# Patient Record
Sex: Female | Born: 1975 | Race: White | Hispanic: No | Marital: Single | Smoking: Never smoker
Health system: Southern US, Community
[De-identification: ages and names within clinical notes are randomized; demographics above are authoritative.]

---

## 2012-01-27 ENCOUNTER — Encounter: Payer: Self-pay | Admitting: Obstetrics and Gynecology

## 2012-01-27 DIAGNOSIS — Z01419 Encounter for gynecological examination (general) (routine) without abnormal findings: Secondary | ICD-10-CM

## 2019-05-11 ENCOUNTER — Emergency Department (HOSPITAL_COMMUNITY): Payer: Self-pay

## 2019-05-11 ENCOUNTER — Other Ambulatory Visit: Payer: Self-pay

## 2019-05-11 ENCOUNTER — Emergency Department (HOSPITAL_COMMUNITY)
Admission: EM | Admit: 2019-05-11 | Discharge: 2019-05-11 | Disposition: A | Discharge status: Home / Self Care | Payer: Self-pay | Attending: Emergency Medicine | Admitting: Emergency Medicine

## 2019-05-11 ENCOUNTER — Encounter (HOSPITAL_COMMUNITY): Payer: Self-pay | Admitting: Emergency Medicine

## 2019-05-11 DIAGNOSIS — W19XXXA Unspecified fall, initial encounter: Secondary | ICD-10-CM

## 2019-05-11 DIAGNOSIS — R52 Pain, unspecified: Secondary | ICD-10-CM

## 2019-05-11 DIAGNOSIS — R0789 Other chest pain: Secondary | ICD-10-CM | POA: Insufficient documentation

## 2019-05-11 DIAGNOSIS — M549 Dorsalgia, unspecified: Secondary | ICD-10-CM

## 2019-05-11 NOTE — ED Provider Notes (Signed)
[facility] DEPT Provider Note   CSN: [id number] Arrival date & time: 05/11/19  0509    History   Chief Complaint Chief Complaint  Patient presents with  . Fall    HPI Shametra Cumberland is a 43 y.o. female.     HPI   Pt is a 43 y/o female who presents to the ED today for eval after a fall. States she was moving the lawn with a push mower when she slipped and fell and the lawn mower landed on her. She is c/o bilat upper back pain and posterior rib pain. States pain is worse when she takes a deep breath. Pain radiates up the back to towards the neck. States she has coughed up a small amount of blood since this happened. She feels somewhat sob from the pain. Has tried taking motrin for her symptoms without relief.   Denies head trauma or loc. No other injuries.   History reviewed. No pertinent past medical history.  There are no active problems to display for this patient.   History reviewed. No pertinent surgical history.   OB History   No obstetric history on file.      Home Medications    Prior to Admission medications   Not on File    Family History History reviewed. No pertinent family history.  Social History Social History   Tobacco Use  . Smoking status: Never Smoker  . Smokeless tobacco: Never Used  Substance Use Topics  . Alcohol use: Never    Frequency: Never  . Drug use: Never     Allergies   Patient has no known allergies.   Review of Systems Review of Systems  Constitutional: Negative for fever.  HENT: Negative for ear pain and sore throat.   Eyes: Negative for visual disturbance.  Respiratory: Positive for shortness of breath (2/2 pain). Negative for cough.        Hemoptysis  Cardiovascular: Negative for chest pain.  Gastrointestinal: Positive for nausea. Negative for abdominal pain, constipation, diarrhea and vomiting.  Genitourinary: Negative for dysuria and hematuria.  Musculoskeletal: Positive for back  pain.  Skin: Negative for color change and rash.  Neurological: Negative for headaches.  All other systems reviewed and are negative.    Physical Exam Updated Vital Signs BP 133/79 (BP Location: Left Arm)   Pulse 85   Temp 98.3 F (36.8 C) (Oral)   Resp 18   Ht 5\' 6"  (1.676 m)   Wt 61.2 kg   LMP 05/08/2019   SpO2 100%   BMI 21.79 kg/m   Physical Exam Vitals signs and nursing note reviewed.  Constitutional:      General: She is not in acute distress.    Appearance: She is well-developed.  HENT:     Head: Normocephalic and atraumatic.  Eyes:     Conjunctiva/sclera: Conjunctivae normal.  Neck:     Musculoskeletal: Neck supple.  Cardiovascular:     Rate and Rhythm: Normal rate and regular rhythm.     Pulses: Normal pulses.     Heart sounds: Normal heart sounds. No murmur.  Pulmonary:     Effort: Pulmonary effort is normal. No respiratory distress.     Breath sounds: Normal breath sounds. No wheezing, rhonchi or rales.  Abdominal:     General: Bowel sounds are normal. There is no distension.     Palpations: Abdomen is soft.     Tenderness: There is no abdominal tenderness. There is no guarding or rebound.  Musculoskeletal:  Comments:   No midline tenderness but does have some tenderness to the bilateral paraspinous muscles of the mid/upper thoracic spine as well as to the posterior ribs bilaterally.  No external signs of trauma.  Skin:    General: Skin is warm and dry.  Neurological:     Mental Status: She is alert.      ED Treatments / Results  Labs (all labs ordered are listed, but only abnormal results are displayed) Labs Reviewed - No data to display  EKG None  Radiology No results found.  Procedures Procedures (including critical care time)  Medications Ordered in ED Medications - No data to display   Initial Impression / Assessment and Plan / ED Course  I have reviewed the triage vital signs and the nursing notes.  Pertinent labs & imaging  results that were available during my care of the patient were reviewed by me and considered in my medical decision making (see chart for details).     Final Clinical Impressions(s) / ED Diagnoses   Final diagnoses:  Fall, initial encounter  Back pain, unspecified back location, unspecified back pain laterality, unspecified chronicity   43 year old female presenting for evaluation after she slipped and fell today in her push lawnmower fell on top of her injuring her backslash posterior ribs.  Has tried Motrin at home without relief.  She has been coughing up some blood today.  She has some shortness of breath secondary to pain.  She has no other associated symptoms.  On exam, patient ambulating in the room and very anxious to leave.  Lungs are clear to auscultation bilaterally.  Cardiac exam is benign.  No midline tenderness but does have some tenderness to the bilateral paraspinous muscles of the mid/upper thoracic spine as well as to the posterior ribs bilaterally.  No external signs of trauma.  I advised patient we would get an x-ray of her chest and bilateral ribs.  She was very anxious to leave and wanted to know how long it would take for the results.  I advised her that it would not be long and we would keep her updated.  I was later informed by nursing staff that patient did not want to wait for her results and wanted to leave AMA.  Patient eloped prior to me being able to evaluate her tell her results of her x-ray.  ED Discharge Orders    None       Rayne DuCouture, Delphin Funes S, PA-C 05/11/19 0650    Paula LibraMolpus, John, MD 05/11/19 [id number]

## 2019-05-11 NOTE — ED Triage Notes (Signed)
Patient states that earlier yesterday she was mowing her hill and she and the lawn mower flipped. Patient thought that she would be ok. Patient states she can not take the pain and she is having blood in her sputum.

## 2019-05-11 NOTE — ED Notes (Signed)
Patient informed that she would have to sign out AMA and was made aware of risks of leaving before tests are completed, but patient states "I have to leave, I am in so much pain and sitting here isn't helping. I have my daughter in the car and I am worried that I didn't lock the door." Patient counseling provided, but patient insisted that she has to leave.

## 2019-05-11 NOTE — ED Notes (Signed)
Pt ambulatory to and from x-ray with a steady gait.

## 2020-04-23 ENCOUNTER — Other Ambulatory Visit: Payer: Self-pay

## 2020-04-23 ENCOUNTER — Emergency Department (HOSPITAL_COMMUNITY)
Admission: EM | Admit: 2020-04-23 | Discharge: 2020-04-23 | Disposition: A | Discharge status: Home / Self Care | Payer: Medicaid Other | Attending: Emergency Medicine | Admitting: Emergency Medicine

## 2020-04-23 ENCOUNTER — Encounter (HOSPITAL_COMMUNITY): Payer: Self-pay

## 2020-04-23 ENCOUNTER — Emergency Department (HOSPITAL_COMMUNITY): Payer: Medicaid Other

## 2020-04-23 DIAGNOSIS — Y939 Activity, unspecified: Secondary | ICD-10-CM | POA: Insufficient documentation

## 2020-04-23 DIAGNOSIS — S52125A Nondisplaced fracture of head of left radius, initial encounter for closed fracture: Secondary | ICD-10-CM | POA: Insufficient documentation

## 2020-04-23 DIAGNOSIS — Y929 Unspecified place or not applicable: Secondary | ICD-10-CM | POA: Insufficient documentation

## 2020-04-23 DIAGNOSIS — W1830XA Fall on same level, unspecified, initial encounter: Secondary | ICD-10-CM | POA: Insufficient documentation

## 2020-04-23 DIAGNOSIS — Y999 Unspecified external cause status: Secondary | ICD-10-CM | POA: Insufficient documentation

## 2020-04-23 DIAGNOSIS — S52502A Unspecified fracture of the lower end of left radius, initial encounter for closed fracture: Secondary | ICD-10-CM | POA: Insufficient documentation

## 2020-04-23 DIAGNOSIS — S59912A Unspecified injury of left forearm, initial encounter: Secondary | ICD-10-CM | POA: Diagnosis present

## 2020-04-23 DIAGNOSIS — W19XXXA Unspecified fall, initial encounter: Secondary | ICD-10-CM

## 2020-04-23 MED ORDER — ONDANSETRON HCL 4 MG PO TABS
4.0000 mg | ORAL_TABLET | Freq: Once | ORAL | Status: AC
  Administered 2020-04-23: 4 mg via ORAL
  Filled 2020-04-23: qty 1

## 2020-04-23 MED ORDER — HYDROCODONE-ACETAMINOPHEN 5-325 MG PO TABS: 1.0000 | ORAL_TABLET | Freq: Four times a day (QID) | ORAL | 0 refills | Status: AC | PRN

## 2020-04-23 MED ORDER — HYDROCODONE-ACETAMINOPHEN 5-325 MG PO TABS
1.0000 | ORAL_TABLET | Freq: Once | ORAL | Status: AC
  Administered 2020-04-23: 1 via ORAL
  Filled 2020-04-23: qty 1

## 2020-04-23 NOTE — Discharge Instructions (Signed)
Take ibuprofen 3 times a day with meals (no more than 4 at a time).  Do not take other anti-inflammatories at the same time (Advil, Motrin, naproxen, Aleve). You may supplement with Tylenol if you need further pain control. Use norco as needed for severe or breakthrough pain. Have caution, this may make you tired or groggy. Do not drive or operate heavy machinery while taking this medicine.  Use ice packs over the splint to help with pain and swelling.  Keep your wrist elevated to help with swelling.  Call the doctor listed below for follow up evaluation of your wrist.  Return to the ER if you develop numbness, severe worsening pain, color change of your fingers, or any new, worsening, or concerning symptoms.

## 2020-04-23 NOTE — ED Notes (Signed)
Ortho at bedside.

## 2020-04-23 NOTE — ED Provider Notes (Signed)
[facility] EMERGENCY DEPARTMENT Provider Note   CSN: [id number] Arrival date & time: 04/23/20  0258     History Chief Complaint  Patient presents with  . Arm Injury    Erica Ryan is a 44 y.o. female presenting for evaluation of left Patient states around 8 PM yesterday she slipped, and fell backwards reaching out with her left arm to catch her fall.  Since then, she has had severe pain in her left wrist/hand, little bit left elbow.  She denies hitting her head or loss of consciousness.  She denies injury elsewhere.  She continued ibuprofen and Excedrin without improvement of symptoms.  She denies numbness or tingling.  She is right-hand dominant.  Pain does not radiate. She is not on blood thinners.   HPI     History reviewed. No pertinent past medical history.  There are no problems to display for this patient.   History reviewed. No pertinent surgical history.   OB History   No obstetric history on file.     No family history on file.  Social History   Tobacco Use  . Smoking status: Never Smoker  . Smokeless tobacco: Never Used  Substance Use Topics  . Alcohol use: Never  . Drug use: Never    Home Medications Prior to Admission medications   Medication Sig Start Date End Date Taking? Authorizing Provider  HYDROcodone-acetaminophen (NORCO/VICODIN) 5-325 MG tablet Take 1 tablet by mouth every 6 (six) hours as needed for severe pain. 04/23/20   Damaria Stofko, PA-C    Allergies    Patient has no known allergies.  Review of Systems   Review of Systems  Musculoskeletal: Positive for arthralgias.  Neurological: Negative for numbness.    Physical Exam Updated Vital Signs BP 127/85 (BP Location: Right Arm)   Pulse 75   Temp 98.5 F (36.9 C) (Oral)   Resp 18   SpO2 98%   Physical Exam Vitals and nursing note reviewed.  Constitutional:      General: She is not in acute distress.    Appearance: She is well-developed.  HENT:   Head: Normocephalic and atraumatic.  Pulmonary:     Effort: Pulmonary effort is normal.  Abdominal:     General: There is no distension.  Musculoskeletal:        General: Swelling and tenderness present. Normal range of motion.     Cervical back: Normal range of motion.     Comments: Swelling of the left radial wrist and radial hand.  Pain in the entire base of the thumb, including over the anatomic labs.  Radial pulse 2+ bilaterally.  Good distal sensation and cap refill of the left fingers.  No tenderness palpation of the forearm.  Mild tenderness palpation of the left radial head.   Skin:    General: Skin is warm.     Capillary Refill: Capillary refill takes less than 2 seconds.     Findings: No rash.  Neurological:     Mental Status: She is alert and oriented to person, place, and time.     ED Results / Procedures / Treatments   Labs (all labs ordered are listed, but only abnormal results are displayed) Labs Reviewed - No data to display  EKG None  Radiology DG Forearm Left  Result Date: 04/23/2020 CLINICAL DATA:  Pain following fall EXAM: LEFT FOREARM - 2 VIEW COMPARISON:  None. FINDINGS: Frontal and lateral views were obtained. Nondisplaced fracture distal radial metaphysis. Suspected subtle impaction injury in  the proximal radial metaphysis. No other fractures are evident. No dislocation. No appreciable joint space narrowing or erosion. IMPRESSION: 1.  Nondisplaced fracture distal radial metaphysis. 2. Suspect subtle impaction injury in the proximal radial metaphysis with subtle cortical irregularity along the dorsal, lateral aspect of the radial metaphysis. 3.  No dislocation.  No evident arthropathy. Electronically Signed   By: Lowella Grip III M.D.   On: 04/23/2020 10:07   DG Wrist Complete Left  Result Date: 04/23/2020 CLINICAL DATA:  Pain following fall EXAM: LEFT WRIST - COMPLETE 3+ VIEW COMPARISON:  None. FINDINGS: Frontal, oblique, lateral, and ulnar deviation  scaphoid images were obtained. There is a nondisplaced fracture of the distal radial metaphysis with alignment essentially anatomic. No other fracture. No dislocation. Joint spaces appear normal. No erosive change. IMPRESSION: Nondisplaced fracture distal radial metaphysis. No other fracture. No dislocation. No appreciable arthropathic change. Electronically Signed   By: Lowella Grip III M.D.   On: 04/23/2020 10:06   DG Hand Complete Left  Result Date: 04/23/2020 CLINICAL DATA:  Pain following fall EXAM: LEFT HAND - COMPLETE 3+ VIEW COMPARISON:  None. FINDINGS: Frontal, oblique, and lateral views were obtained. There is a nondisplaced fracture of the distal radial metaphysis. No other fracture evident. No dislocation. Joint spaces appear normal. There is a bone island in the proximal aspect of the second proximal phalanx. IMPRESSION: Nondisplaced fracture distal radial metaphysis. No other fracture. No dislocation. No appreciable joint space narrowing or erosion. Electronically Signed   By: Lowella Grip III M.D.   On: 04/23/2020 10:05    Procedures Procedures (including critical care time)  Medications Ordered in ED Medications  ondansetron (ZOFRAN) tablet 4 mg (has no administration in time range)  HYDROcodone-acetaminophen (NORCO/VICODIN) 5-325 MG per tablet 1 tablet (has no administration in time range)    ED Course  I have reviewed the triage vital signs and the nursing notes.  Pertinent labs & imaging results that were available during my care of the patient were reviewed by me and considered in my medical decision making (see chart for details).    MDM Rules/Calculators/A&P                      Pt presenting for evaluation of left wrist and arm pain after a fall last night.  On exam, patient has mild swelling and tenderness.  She is neurovascularly intact.  Compartments are soft, not consistent with compartment syndrome.  X-rays obtained from triage read interpreted by me,  shows distal radius fracture which is nondisplaced.  Per radiology, possible impaction fracture of the radial head.  Discussed findings with patient.  Discussed treatment with a splint and sling.  As patient has pain over the anatomic snuffbox as well, will place in a thumb spica although no scaphoid fracture was noted on x-ray.  Discussed importance of ice, elevation, pain control.  Encourage follow-up with hand for further management.  At this time, patient appears safe for discharge.  Return precautions given.  Patient states she understands and agrees to plan.  Final Clinical Impression(s) / ED Diagnoses Final diagnoses:  Closed fracture of distal end of left radius, unspecified fracture morphology, initial encounter  Closed nondisplaced fracture of head of left radius, initial encounter  Fall, initial encounter    Rx / DC Orders ED Discharge Orders         Ordered    HYDROcodone-acetaminophen (NORCO/VICODIN) 5-325 MG tablet  Every 6 hours PRN     04/23/20 1113  Alveria Apley, PA-C 04/23/20 1119    Raeford Razor, MD 04/23/20 1143

## 2020-04-23 NOTE — ED Notes (Signed)
Pt d/c home per MD order. Discharge summary reviewed with pt, pt verbalizes understanding. Reports she has a discharge ride home Ambulatory off unit. No s.s of acute distress noted.

## 2020-04-23 NOTE — Progress Notes (Signed)
Orthopedic Tech Progress Note Patient Details:  Erica Ryan 1976/11/03 [id number]  Ortho Devices Type of Ortho Device: Arm sling, Thumb velcro splint Ortho Device/Splint Location: LUE Ortho Device/Splint Interventions: Ordered, Application   Post Interventions Patient Tolerated: Well Instructions Provided: Care of device   Donald Pore 04/23/2020, 11:37 AM

## 2020-04-23 NOTE — ED Notes (Signed)
Ortho tech called 

## 2020-04-23 NOTE — ED Triage Notes (Signed)
Pt reports mechanical fall last night, caught herself on her left hand, pt c.o pain to left hand all the way up her forearm. Swelling and bruising noted to thumb and wrist.

## 2020-04-24 ENCOUNTER — Other Ambulatory Visit: Payer: Self-pay

## 2020-04-24 ENCOUNTER — Encounter (HOSPITAL_COMMUNITY): Payer: Self-pay

## 2020-04-24 ENCOUNTER — Emergency Department (HOSPITAL_COMMUNITY)
Admission: EM | Admit: 2020-04-24 | Discharge: 2020-04-24 | Disposition: A | Discharge status: Home / Self Care | Payer: Medicaid Other | Attending: Emergency Medicine | Admitting: Emergency Medicine

## 2020-04-24 ENCOUNTER — Emergency Department (HOSPITAL_COMMUNITY)
Admission: EM | Admit: 2020-04-24 | Discharge: 2020-04-24 | Disposition: A | Discharge status: Home / Self Care | Payer: Self-pay | Attending: Emergency Medicine | Admitting: Emergency Medicine

## 2020-04-24 ENCOUNTER — Encounter (HOSPITAL_COMMUNITY): Payer: Self-pay | Admitting: Emergency Medicine

## 2020-04-24 DIAGNOSIS — Z5321 Procedure and treatment not carried out due to patient leaving prior to being seen by health care provider: Secondary | ICD-10-CM | POA: Diagnosis not present

## 2020-04-24 DIAGNOSIS — M79602 Pain in left arm: Secondary | ICD-10-CM | POA: Insufficient documentation

## 2020-04-24 DIAGNOSIS — M79642 Pain in left hand: Secondary | ICD-10-CM | POA: Insufficient documentation

## 2020-04-24 MED ORDER — NAPROXEN 500 MG PO TABS: 500.0000 mg | ORAL_TABLET | Freq: Two times a day (BID) | ORAL | 0 refills | Status: AC

## 2020-04-24 MED ORDER — OXYCODONE-ACETAMINOPHEN 5-325 MG PO TABS
1.0000 | ORAL_TABLET | Freq: Once | ORAL | Status: AC
  Administered 2020-04-24: 1 via ORAL
  Filled 2020-04-24: qty 1

## 2020-04-24 NOTE — ED Triage Notes (Signed)
Pt returns for left arm pain, seen yesterday and given a rx for Norco, states it does not help for pain

## 2020-04-24 NOTE — ED Notes (Signed)
Patient verbalizes understanding of discharge instructions. Opportunity for questioning and answers were provided. Pt discharged from ED. 

## 2020-04-24 NOTE — ED Triage Notes (Signed)
Pt c/o left arm pain, getting worse seen she was saw yesterday, pt states pain is not getting any better and the pain medication prescribed is not working.

## 2020-04-24 NOTE — ED Notes (Signed)
PT stated that she could no wait had to go pick up her daughter for school.

## 2020-04-24 NOTE — Discharge Instructions (Signed)
You have been seen today for hand pain. Please read and follow all provided instructions. Return to the emergency room for worsening condition or new concerning symptoms.    1. Medications:  Prescription sent to your pharmacy for naproxen.  This is an anti-inflammatory and will help with pain and swelling.  You should take this medicine with food so it does not cause an upset stomach.  Do not take any additional Aleve, ibuprofen, Motrin, Goody powders as these medicines are all similar. You can take this at the same time as the pain medicine to help with your pain.  Continue usual home medications Take medications as prescribed. Please review all of the medicines and only take them if you do not have an allergy to them.   2. Treatment: rest.  Wear the splint until you follow-up with a hand doctor  3. Follow Up:  Please follow up with the hand doctor they told you to see yesterday.  Call the office today to schedule the next available appointment.  Ultimately you need to see the hand doctor to help with your pain and swelling of your broken bones.   It is also a possibility that you have an allergic reaction to any of the medicines that you have been prescribed - Everybody reacts differently to medications and while MOST people have no trouble with most medicines, you may have a reaction such as nausea, vomiting, rash, swelling, shortness of breath. If this is the case, please stop taking the medicine immediately and contact your physician.  ?

## 2020-04-24 NOTE — ED Provider Notes (Signed)
[facility] EMERGENCY DEPARTMENT Provider Note   CSN: [id number] Arrival date & time: 04/24/20  9562     History Chief Complaint  Patient presents with  . Arm Pain    Erica Ryan is a 44 y.o. female with no known past medical history presenting to emergency department today with chief complaint of progressively worsening left arm pain.  Patient is right-hand dominant.  She was seen in the emergency department yesterday after a mechanical fall and found to have nondisplaced distal radius fracture as well as possible impaction fracture of the radial head.  Patient was treated with a sling.  She also had anatomic snuffbox tenderness and thumb spica was applied although x-ray was negative for any scaphoid fracture.  Patient was discharged home with prescription for Norco.  She was advised to follow-up with the on-call hand doctor.  Patient states the Norco did not help her pain.  She is rating pain 7 out of 10 in severity.  She describes the pain as a constant aching sensation.  Pain is worse with movement.  Pain is located in her left hand and does not radiate.  She has not been wearing the splint.  She denies any numbness or tingling.  She is not anticoagulated.  History reviewed. No pertinent past medical history.  There are no problems to display for this patient.   History reviewed. No pertinent surgical history.   OB History   No obstetric history on file.     No family history on file.  Social History   Tobacco Use  . Smoking status: Never Smoker  . Smokeless tobacco: Never Used  Substance Use Topics  . Alcohol use: Never  . Drug use: Never    Home Medications Prior to Admission medications   Medication Sig Start Date End Date Taking? Authorizing Provider  HYDROcodone-acetaminophen (NORCO/VICODIN) 5-325 MG tablet Take 1 tablet by mouth every 6 (six) hours as needed for severe pain. 04/23/20   Caccavale, Sophia, PA-C  naproxen (NAPROSYN) 500 MG tablet  Take 1 tablet (500 mg total) by mouth 2 (two) times daily for 5 days. 04/24/20 04/29/20  Ed Mandich, Caroleen Hamman, PA-C    Allergies    Patient has no known allergies.  Review of Systems   Review of Systems All other systems are reviewed and are negative for acute change except as noted in the HPI.  Physical Exam Updated Vital Signs BP 124/86 (BP Location: Right Arm)   Pulse 66   Temp 98.7 F (37.1 C) (Oral)   Resp 14   SpO2 100%   Physical Exam Vitals and nursing note reviewed.  Constitutional:      Appearance: She is well-developed. She is not ill-appearing or toxic-appearing.  HENT:     Head: Normocephalic and atraumatic.     Nose: Nose normal.  Eyes:     General: No scleral icterus.       Right eye: No discharge.        Left eye: No discharge.     Conjunctiva/sclera: Conjunctivae normal.  Neck:     Vascular: No JVD.  Cardiovascular:     Rate and Rhythm: Normal rate and regular rhythm.     Pulses: Normal pulses.          Radial pulses are 2+ on the right side and 2+ on the left side.     Heart sounds: Normal heart sounds.  Pulmonary:     Effort: Pulmonary effort is normal.     Breath sounds:  Normal breath sounds.  Abdominal:     General: There is no distension.  Musculoskeletal:        General: Normal range of motion.     Cervical back: Normal range of motion.     Comments: left hand with tenderness to palpation of dorsum and left thumb.   + swelling. There is no joint effusion noted. Decreased ROM 2/2 pain. No erythema or warmth overlaying the joint. There is anatomic snuff box tenderness. Normal sensation and motor function in the median, ulnar, and radial nerve distributions. 2+ radial pulse.   Skin:    General: Skin is warm and dry.  Neurological:     Mental Status: She is oriented to person, place, and time.     GCS: GCS eye subscore is 4. GCS verbal subscore is 5. GCS motor subscore is 6.     Comments: Fluent speech, no facial droop.  Psychiatric:         Behavior: Behavior normal.     ED Results / Procedures / Treatments   Labs (all labs ordered are listed, but only abnormal results are displayed) Labs Reviewed - No data to display  EKG None  Radiology DG Forearm Left  Result Date: 04/23/2020 CLINICAL DATA:  Pain following fall EXAM: LEFT FOREARM - 2 VIEW COMPARISON:  None. FINDINGS: Frontal and lateral views were obtained. Nondisplaced fracture distal radial metaphysis. Suspected subtle impaction injury in the proximal radial metaphysis. No other fractures are evident. No dislocation. No appreciable joint space narrowing or erosion. IMPRESSION: 1.  Nondisplaced fracture distal radial metaphysis. 2. Suspect subtle impaction injury in the proximal radial metaphysis with subtle cortical irregularity along the dorsal, lateral aspect of the radial metaphysis. 3.  No dislocation.  No evident arthropathy. Electronically Signed   By: Bretta Bang III M.D.   On: 04/23/2020 10:07   DG Wrist Complete Left  Result Date: 04/23/2020 CLINICAL DATA:  Pain following fall EXAM: LEFT WRIST - COMPLETE 3+ VIEW COMPARISON:  None. FINDINGS: Frontal, oblique, lateral, and ulnar deviation scaphoid images were obtained. There is a nondisplaced fracture of the distal radial metaphysis with alignment essentially anatomic. No other fracture. No dislocation. Joint spaces appear normal. No erosive change. IMPRESSION: Nondisplaced fracture distal radial metaphysis. No other fracture. No dislocation. No appreciable arthropathic change. Electronically Signed   By: Bretta Bang III M.D.   On: 04/23/2020 10:06   DG Hand Complete Left  Result Date: 04/23/2020 CLINICAL DATA:  Pain following fall EXAM: LEFT HAND - COMPLETE 3+ VIEW COMPARISON:  None. FINDINGS: Frontal, oblique, and lateral views were obtained. There is a nondisplaced fracture of the distal radial metaphysis. No other fracture evident. No dislocation. Joint spaces appear normal. There is a bone island in  the proximal aspect of the second proximal phalanx. IMPRESSION: Nondisplaced fracture distal radial metaphysis. No other fracture. No dislocation. No appreciable joint space narrowing or erosion. Electronically Signed   By: Bretta Bang III M.D.   On: 04/23/2020 10:05    Procedures Procedures (including critical care time)  Medications Ordered in ED Medications  oxyCODONE-acetaminophen (PERCOCET/ROXICET) 5-325 MG per tablet 1 tablet (has no administration in time range)    ED Course  I have reviewed the triage vital signs and the nursing notes.  Pertinent labs & imaging results that were available during my care of the patient were reviewed by me and considered in my medical decision making (see chart for details).    MDM Rules/Calculators/A&P  History provided by patient with additional history obtained from chart review.    Patient seen and examined. Patient presents awake, alert, hemodynamically stable, afebrile, non toxic.  Patient has worsening pain after fall with known nondisplaced fracture of distal radial metaphysis.  She has anatomic snuffbox tenderness.  She is not wearing the splint on my exam.  I offered her a repeat x-ray given her pain has worsened.  Patient states she has to leave to pick up her child from school and does not want to stay for the x-ray.  Her compartments are soft and left upper extremity.  She has full range of motion of her wrist.  Brisk cap refill.  Radial pulse 2+.  I will give patient dose of Percocet here as she has a ride home today.  I will also give prescription for naproxen to take in addition to the [place] she was prescribed yesterday.  She denies chance of pregnancy.  I discussed with patient the importance of wearing the splint and sling as well as following up with the on-call hand attending for definitive care. The patient appears reasonably screened and/or stabilized for discharge and I doubt any other medical condition or  other [facility] requiring further screening, evaluation, or treatment in the ED at this time prior to discharge. The patient is safe for discharge with strict return precautions discussed.    Portions of this note were generated with [employer]. Dictation errors may occur despite best attempts at proofreading.   Final Clinical Impression(s) / ED Diagnoses Final diagnoses:  Left hand pain    Rx / DC Orders ED Discharge Orders         Ordered    naproxen (NAPROSYN) 500 MG tablet  2 times daily     04/24/20 0928           Monica Codd, Harley Hallmark, PA-C 04/24/20 8101    Varney Biles, MD 04/26/20 1512

## 2021-07-18 IMAGING — DX DG WRIST COMPLETE 3+V*L*
4 series · 4 of 4 positions shown · non-contrast
Comparison: None.

CLINICAL DATA: Pain following fall

EXAM:
LEFT WRIST - COMPLETE 3+ VIEW

[wrist pa]
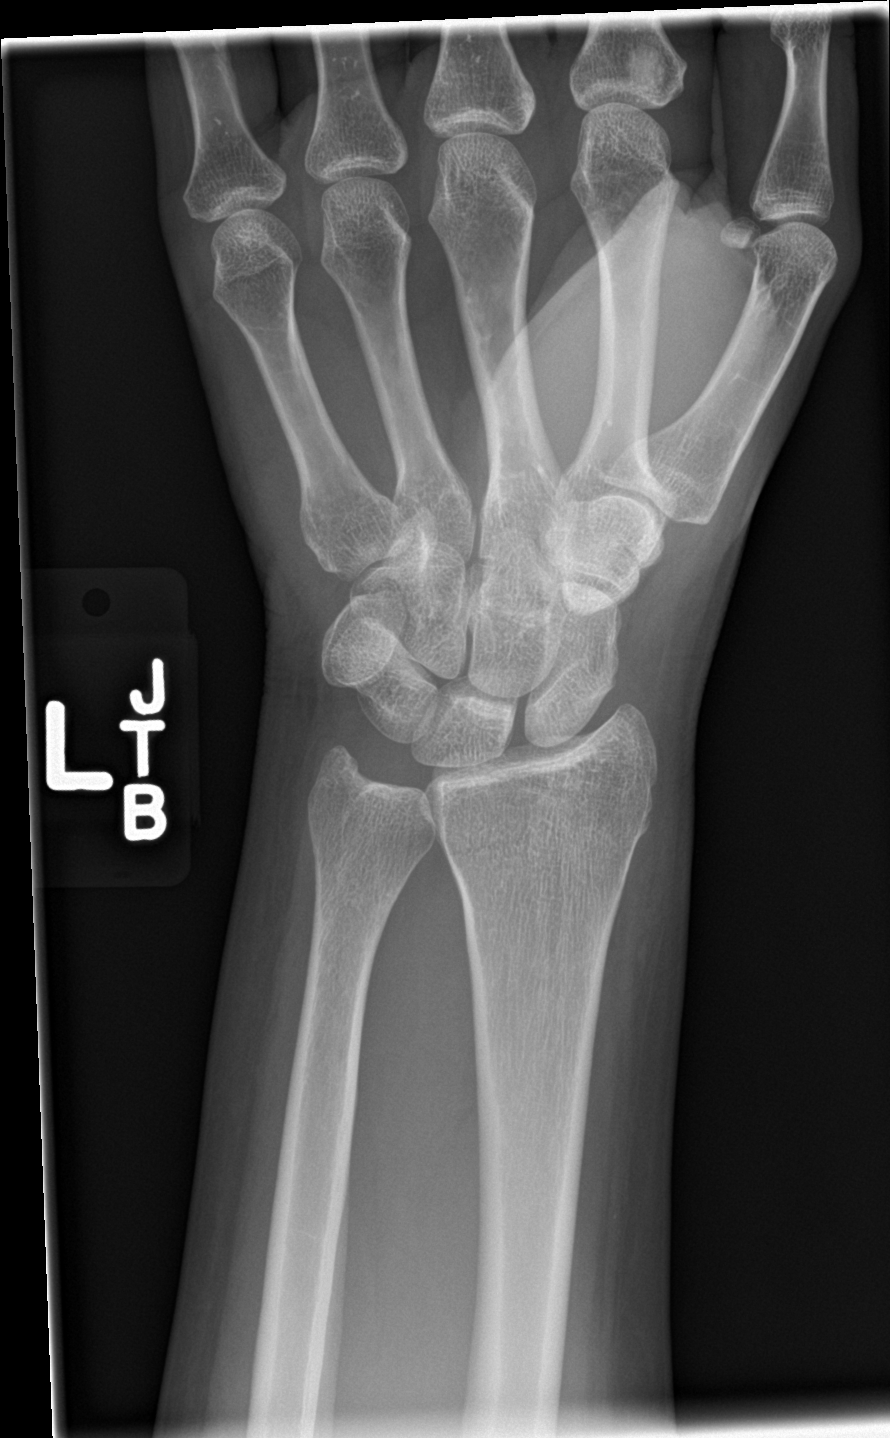

[wrist obl]
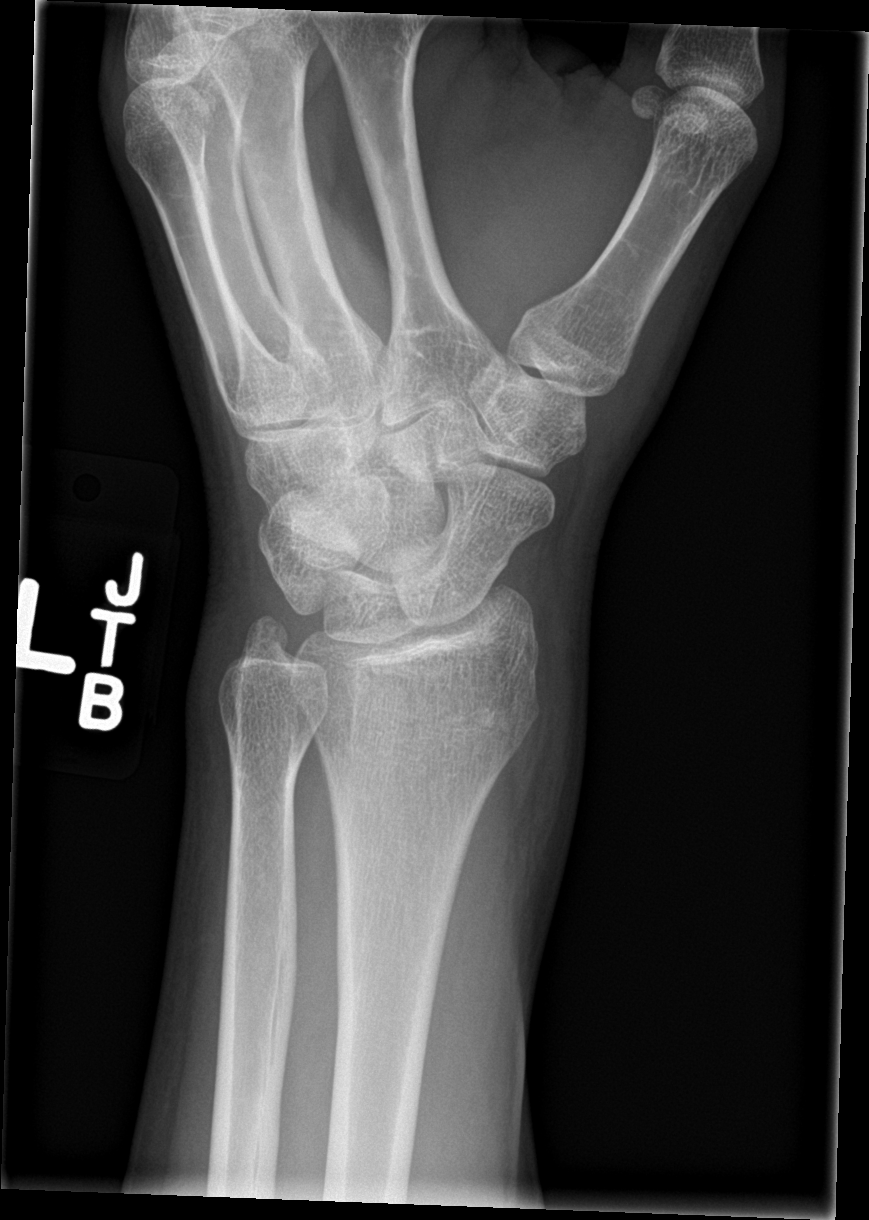

[wrist lat]
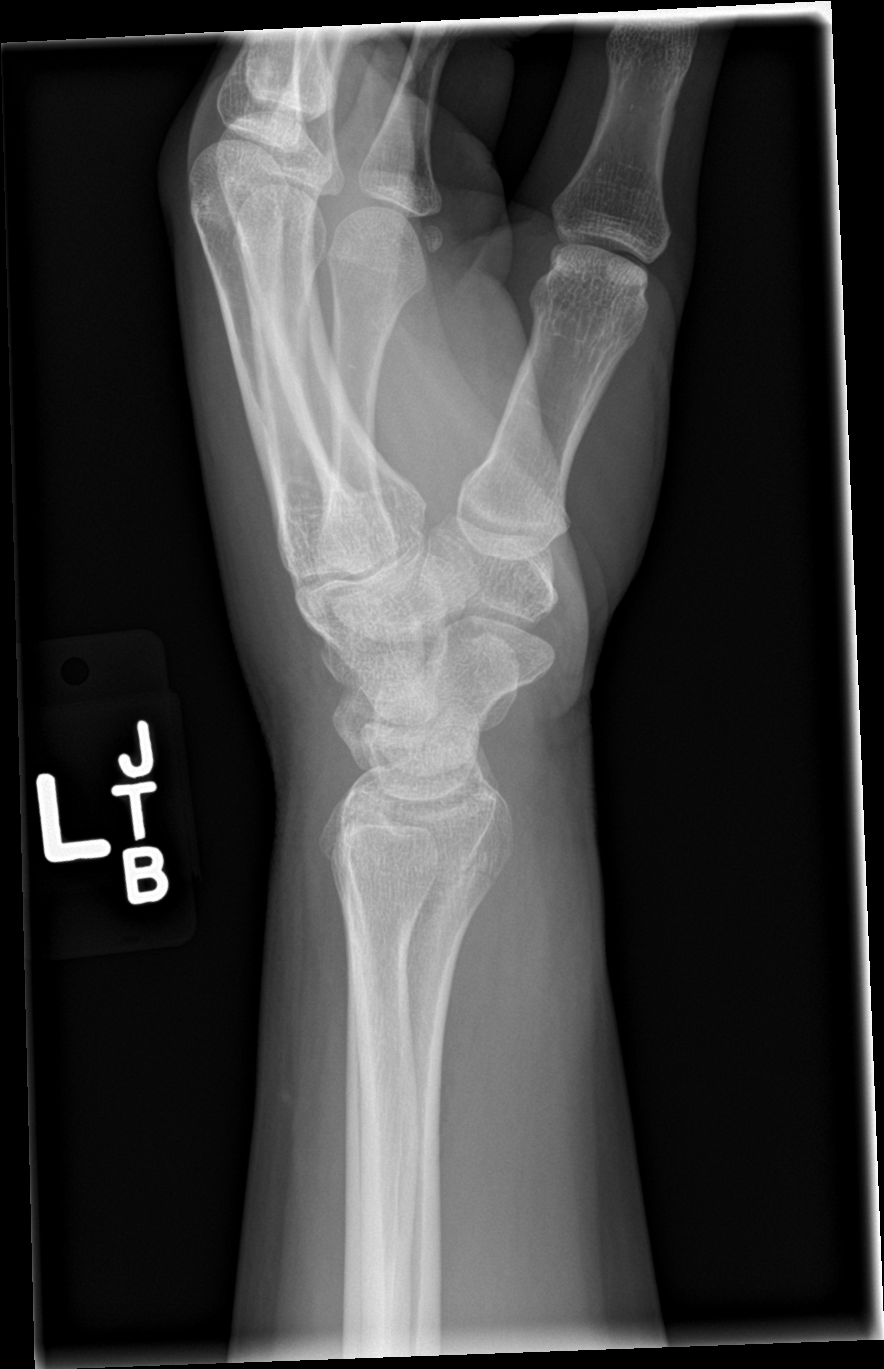

[wrist navicular]
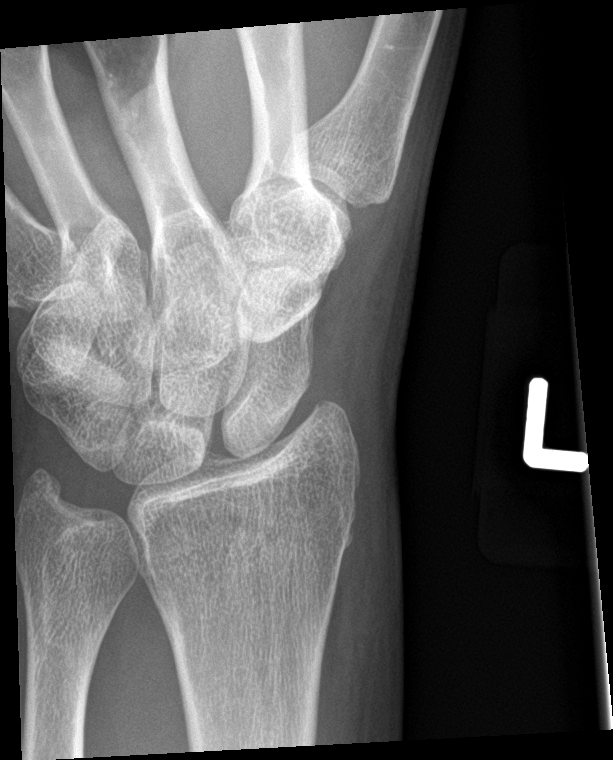

[4 of 4 positions shown; findings below may reference images not displayed]

FINDINGS: Frontal, oblique, lateral, and ulnar deviation scaphoid images were
obtained. There is a nondisplaced fracture of the distal radial
metaphysis with alignment essentially anatomic. No other fracture.
No dislocation. Joint spaces appear normal. No erosive change.
IMPRESSION: Nondisplaced fracture distal radial metaphysis. No other fracture.
No dislocation. No appreciable arthropathic change.

## 2021-07-18 IMAGING — DX DG FOREARM 2V*L*
2 series · 2 of 2 positions shown · non-contrast
Comparison: None.

CLINICAL DATA: Pain following fall

EXAM:
LEFT FOREARM - 2 VIEW

[forearm ap]
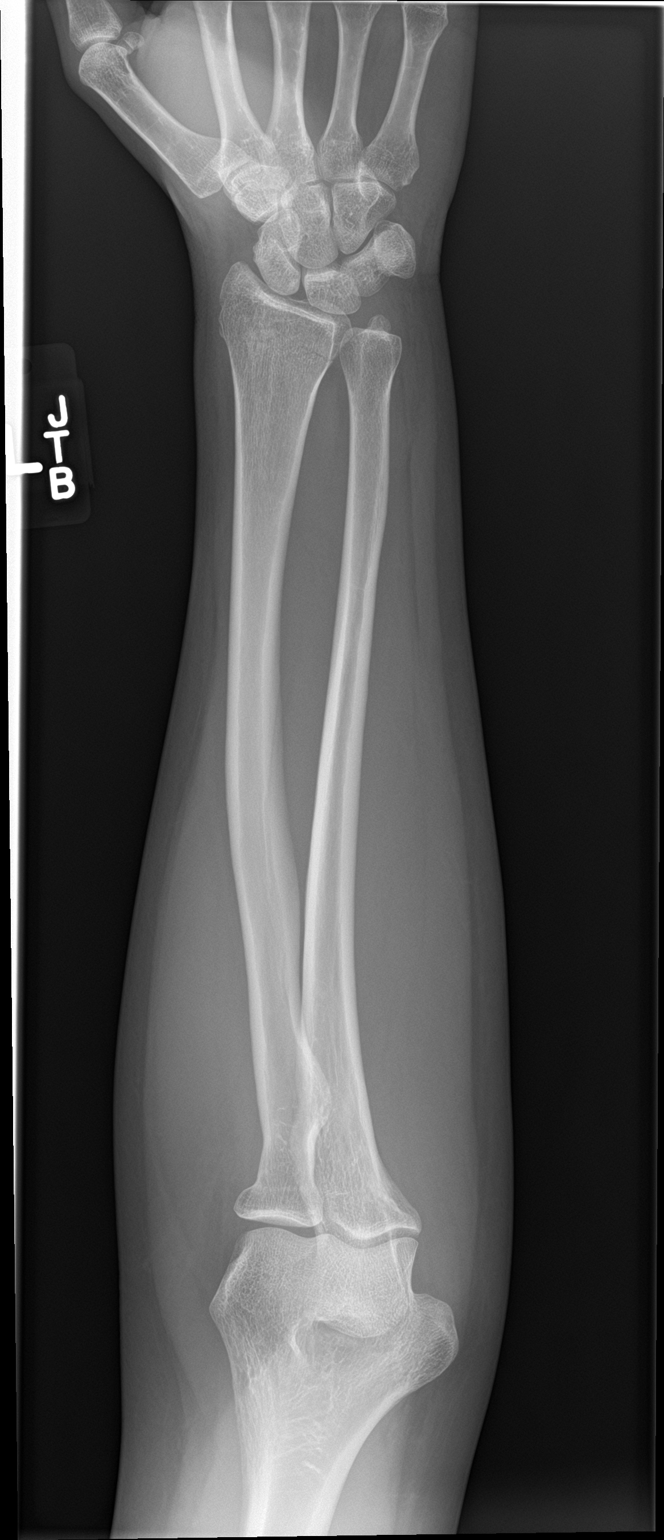

[forearm lat]
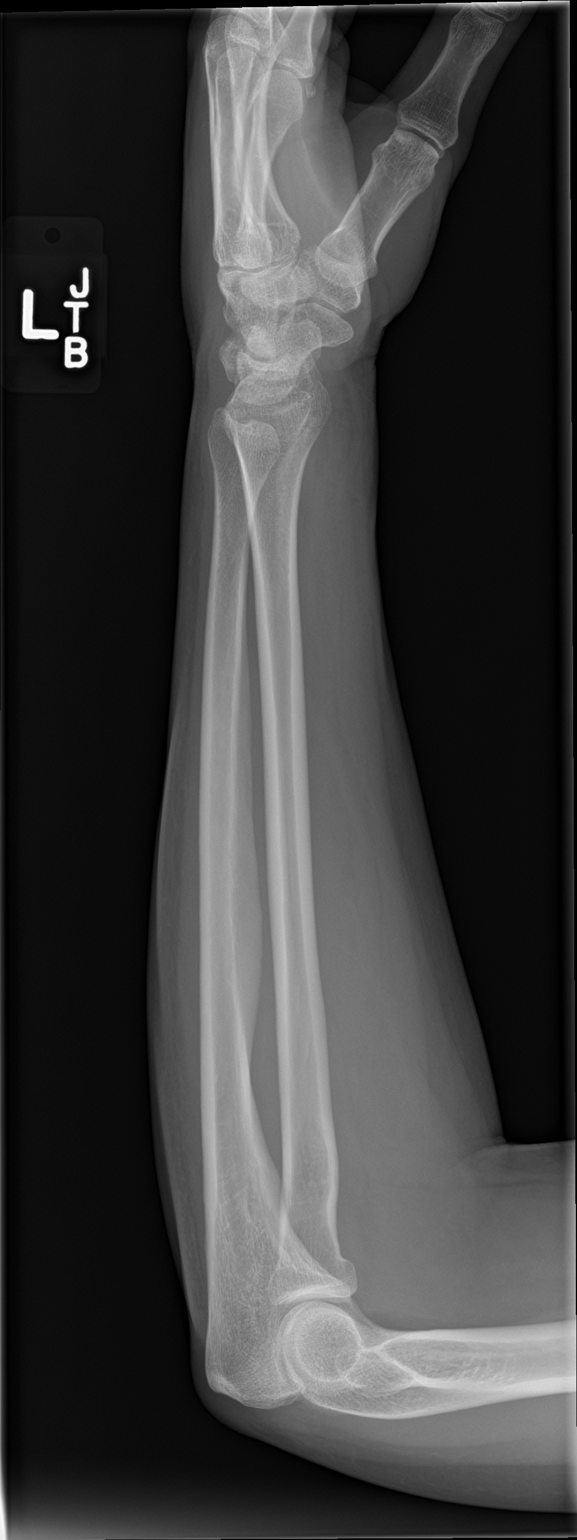

[2 of 2 positions shown; findings below may reference images not displayed]

FINDINGS: Frontal and lateral views were obtained. Nondisplaced fracture
distal radial metaphysis. Suspected subtle impaction injury in the
proximal radial metaphysis. No other fractures are evident. No
dislocation. No appreciable joint space narrowing or erosion.
IMPRESSION: 1.  Nondisplaced fracture distal radial metaphysis.

2. Suspect subtle impaction injury in the proximal radial metaphysis
with subtle cortical irregularity along the dorsal, lateral aspect
of the radial metaphysis.

3.  No dislocation.  No evident arthropathy.
